# Patient Record
Sex: Male | Born: 1970 | Race: White | Marital: Married | Smoking: Current every day smoker
Health system: Southern US, Community
[De-identification: ages and names within clinical notes are randomized; demographics above are authoritative.]

## PROBLEM LIST (undated history)

## (undated) DIAGNOSIS — E785 Hyperlipidemia, unspecified: Secondary | ICD-10-CM

## (undated) DIAGNOSIS — E039 Hypothyroidism, unspecified: Secondary | ICD-10-CM

---

## 2018-05-03 ENCOUNTER — Emergency Department

## 2018-05-03 ENCOUNTER — Encounter: Payer: Self-pay | Admitting: Emergency Medicine

## 2018-05-03 ENCOUNTER — Other Ambulatory Visit: Payer: Self-pay

## 2018-05-03 ENCOUNTER — Inpatient Hospital Stay
Admission: EM | Admit: 2018-05-03 | Discharge: 2018-05-05 | DRG: 871 | Disposition: A | Discharge status: Home / Self Care | Attending: Internal Medicine | Admitting: Internal Medicine

## 2018-05-03 DIAGNOSIS — Z79899 Other long term (current) drug therapy: Secondary | ICD-10-CM | POA: Diagnosis not present

## 2018-05-03 DIAGNOSIS — J189 Pneumonia, unspecified organism: Secondary | ICD-10-CM | POA: Diagnosis present

## 2018-05-03 DIAGNOSIS — E039 Hypothyroidism, unspecified: Secondary | ICD-10-CM | POA: Diagnosis present

## 2018-05-03 DIAGNOSIS — E785 Hyperlipidemia, unspecified: Secondary | ICD-10-CM | POA: Diagnosis present

## 2018-05-03 DIAGNOSIS — A419 Sepsis, unspecified organism: Secondary | ICD-10-CM | POA: Diagnosis present

## 2018-05-03 DIAGNOSIS — F17210 Nicotine dependence, cigarettes, uncomplicated: Secondary | ICD-10-CM | POA: Diagnosis present

## 2018-05-03 DIAGNOSIS — K920 Hematemesis: Secondary | ICD-10-CM | POA: Diagnosis not present

## 2018-05-03 HISTORY — DX: Hypothyroidism, unspecified: E03.9

## 2018-05-03 HISTORY — DX: Hyperlipidemia, unspecified: E78.5

## 2018-05-03 LAB — CBC WITH DIFFERENTIAL/PLATELET
BASOS ABS: 0 10*3/uL (ref 0–0.1)
Basophils Relative: 0 %
EOS ABS: 0 10*3/uL (ref 0–0.7)
EOS PCT: 0 %
HCT: 39.5 % — ABNORMAL LOW (ref 40.0–52.0)
Hemoglobin: 13.9 g/dL (ref 13.0–18.0)
Lymphocytes Relative: 3 %
Lymphs Abs: 0.6 10*3/uL — ABNORMAL LOW (ref 1.0–3.6)
MCH: 32.3 pg (ref 26.0–34.0)
MCHC: 35.2 g/dL (ref 32.0–36.0)
MCV: 91.8 fL (ref 80.0–100.0)
MONO ABS: 1.3 10*3/uL — AB (ref 0.2–1.0)
Monocytes Relative: 8 %
Neutro Abs: 14.4 10*3/uL — ABNORMAL HIGH (ref 1.4–6.5)
Neutrophils Relative %: 89 %
PLATELETS: 194 10*3/uL (ref 150–440)
RBC: 4.3 MIL/uL — AB (ref 4.40–5.90)
RDW: 12.6 % (ref 11.5–14.5)
WBC: 16.4 10*3/uL — AB (ref 3.8–10.6)

## 2018-05-03 LAB — COMPREHENSIVE METABOLIC PANEL
ALT: 28 U/L (ref 0–44)
AST: 21 U/L (ref 15–41)
Albumin: 3.5 g/dL (ref 3.5–5.0)
Alkaline Phosphatase: 99 U/L (ref 38–126)
Anion gap: 9 (ref 5–15)
BUN: 14 mg/dL (ref 6–20)
CO2: 23 mmol/L (ref 22–32)
Calcium: 9.2 mg/dL (ref 8.9–10.3)
Chloride: 101 mmol/L (ref 98–111)
Creatinine, Ser: 0.83 mg/dL (ref 0.61–1.24)
GFR calc Af Amer: >60 mL/min (ref >60)
GFR calc non Af Amer: >60 mL/min (ref >60)
Glucose, Bld: 150 mg/dL — ABNORMAL HIGH (ref 70–99)
POTASSIUM: 3.3 mmol/L — AB (ref 3.5–5.1)
SODIUM: 133 mmol/L — AB (ref 135–145)
Total Bilirubin: 1.3 mg/dL — ABNORMAL HIGH (ref 0.3–1.2)
Total Protein: 7.5 g/dL (ref 6.5–8.1)

## 2018-05-03 LAB — INFLUENZA PANEL BY PCR (TYPE A & B)
INFLAPCR: NEGATIVE
Influenza B By PCR: NEGATIVE

## 2018-05-03 LAB — URINALYSIS, COMPLETE (UACMP) WITH MICROSCOPIC
Bacteria, UA: NONE SEEN
Glucose, UA: NEGATIVE mg/dL
Ketones, ur: 5 mg/dL — AB
LEUKOCYTES UA: NEGATIVE
Nitrite: NEGATIVE
Protein, ur: >=300 mg/dL — AB
SPECIFIC GRAVITY, URINE: 1.04 — AB (ref 1.005–1.030)
pH: 5 (ref 5.0–8.0)

## 2018-05-03 LAB — LACTIC ACID, PLASMA: Lactic Acid, Venous: 0.8 mmol/L (ref 0.5–1.9)

## 2018-05-03 MED ORDER — KETOROLAC TROMETHAMINE 15 MG/ML IJ SOLN
15.0000 mg | Freq: Once | INTRAMUSCULAR | Status: AC
  Administered 2018-05-03: 15 mg via INTRAVENOUS
  Filled 2018-05-03: qty 1

## 2018-05-03 MED ORDER — ACETAMINOPHEN 650 MG RE SUPP: 650.0000 mg | Freq: Four times a day (QID) | RECTAL | Status: DC | PRN

## 2018-05-03 MED ORDER — SODIUM CHLORIDE 0.9 % IV SOLN
500.0000 mg | Freq: Once | INTRAVENOUS | Status: AC
  Administered 2018-05-03: 500 mg via INTRAVENOUS
  Filled 2018-05-03: qty 500

## 2018-05-03 MED ORDER — LEVOTHYROXINE SODIUM 112 MCG PO TABS
112.0000 ug | ORAL_TABLET | Freq: Every day | ORAL | Status: DC
  Administered 2018-05-04 – 2018-05-05 (×2): 112 ug via ORAL
  Filled 2018-05-03 (×3): qty 1

## 2018-05-03 MED ORDER — ACETAMINOPHEN 325 MG PO TABS
650.0000 mg | ORAL_TABLET | Freq: Four times a day (QID) | ORAL | Status: DC | PRN
  Administered 2018-05-03: 650 mg via ORAL
  Filled 2018-05-03: qty 2

## 2018-05-03 MED ORDER — SODIUM CHLORIDE 0.9 % IV BOLUS
1000.0000 mL | Freq: Once | INTRAVENOUS | Status: AC
  Administered 2018-05-03: 1000 mL via INTRAVENOUS

## 2018-05-03 MED ORDER — IPRATROPIUM-ALBUTEROL 0.5-2.5 (3) MG/3ML IN SOLN
3.0000 mL | RESPIRATORY_TRACT | Status: DC | PRN
  Administered 2018-05-03 – 2018-05-05 (×4): 3 mL via RESPIRATORY_TRACT
  Filled 2018-05-03 (×4): qty 3

## 2018-05-03 MED ORDER — SODIUM CHLORIDE 0.9 % IV SOLN
INTRAVENOUS | Status: DC
  Administered 2018-05-03 – 2018-05-04 (×4): via INTRAVENOUS

## 2018-05-03 MED ORDER — SODIUM CHLORIDE 0.9 % IV SOLN
1.0000 g | INTRAVENOUS | Status: DC
  Administered 2018-05-04: 1 g via INTRAVENOUS
  Filled 2018-05-03: qty 1

## 2018-05-03 MED ORDER — IOHEXOL 350 MG/ML SOLN
75.0000 mL | Freq: Once | INTRAVENOUS | Status: AC | PRN
  Administered 2018-05-03: 75 mL via INTRAVENOUS

## 2018-05-03 MED ORDER — ATORVASTATIN CALCIUM 20 MG PO TABS
40.0000 mg | ORAL_TABLET | Freq: Every day | ORAL | Status: DC
  Filled 2018-05-03: qty 2

## 2018-05-03 MED ORDER — ENOXAPARIN SODIUM 40 MG/0.4ML ~~LOC~~ SOLN
40.0000 mg | SUBCUTANEOUS | Status: DC
  Administered 2018-05-04: 40 mg via SUBCUTANEOUS
  Filled 2018-05-03: qty 0.4

## 2018-05-03 MED ORDER — SODIUM CHLORIDE 0.9 % IV SOLN
1.0000 g | Freq: Once | INTRAVENOUS | Status: AC
  Administered 2018-05-03: 1 g via INTRAVENOUS
  Filled 2018-05-03: qty 10

## 2018-05-03 MED ORDER — AZITHROMYCIN 250 MG PO TABS
250.0000 mg | ORAL_TABLET | Freq: Every day | ORAL | Status: DC
  Administered 2018-05-04 – 2018-05-05 (×2): 250 mg via ORAL
  Filled 2018-05-03 (×2): qty 1

## 2018-05-03 MED ORDER — ONDANSETRON HCL 4 MG PO TABS: 4.0000 mg | ORAL_TABLET | Freq: Four times a day (QID) | ORAL | Status: DC | PRN

## 2018-05-03 MED ORDER — ONDANSETRON HCL 4 MG/2ML IJ SOLN: 4.0000 mg | Freq: Four times a day (QID) | INTRAMUSCULAR | Status: DC | PRN

## 2018-05-03 MED ORDER — IBUPROFEN 600 MG PO TABS
600.0000 mg | ORAL_TABLET | Freq: Once | ORAL | Status: AC
  Administered 2018-05-03: 600 mg via ORAL
  Filled 2018-05-03: qty 1

## 2018-05-03 NOTE — ED Notes (Signed)
Report to JD, RN

## 2018-05-03 NOTE — ED Provider Notes (Signed)
[facility] Emergency Department Provider Note  ____________________________________________   I have reviewed the triage vital signs and the nursing notes. Where available I have reviewed prior notes and, if possible and indicated, outside hospital notes.    HISTORY  Chief Complaint Generalized Body Aches    HPI Eugene Barnes is a 47 y.o. male  Who is healthy, states over the last week he had cough, headache, body aches, chills, and fever, he went to a urgent care and was told that he had a viral syndrome, they did however give him a prescription for azithromycin but he did not fill it because he was told it was a viral syndrome he states.  He states he has been feeling generally somewhat weak and debilitated and he persistent feeling bad.  He has had a cough.  When he coughs it makes his head hurt.  Is not the worst headache of life, the biggest problem is a cough and shortness of breath which is starting to progress.  He does work as a Camera operator, no leg swelling no history of PE or DVT. No leg swelling, no family history of PE or DVT.   Past Medical History:  Diagnosis Date  . Hyperlipidemia   . Hypothyroidism     Patient Active Problem List   Diagnosis Date Noted  . Sepsis (HCC) 05/03/2018    History reviewed. No pertinent surgical history.  Prior to Admission medications   Medication Sig Start Date End Date Taking? Authorizing Provider  acetaminophen (TYLENOL) 500 MG tablet Take 500-1,000 mg by mouth every 6 (six) hours as needed for moderate pain or fever.   Yes [provider]  atorvastatin (LIPITOR) 40 MG tablet Take 40 mg by mouth daily.   Yes [provider]  ibuprofen (ADVIL,MOTRIN) 200 MG tablet Take 400-600 mg by mouth every 6 (six) hours as needed for fever or moderate pain.   Yes [provider]  levothyroxine (SYNTHROID, LEVOTHROID) 112 MCG tablet Take 112 mcg by mouth daily before breakfast.   Yes  [provider]    Allergies Patient has no known allergies.  Family History  Problem Relation Age of Onset  . Epilepsy Mother   . Diabetes Father   . Hypertension Father     Social History Social History   Tobacco Use  . Smoking status: Current Every Day Smoker    Packs/day: 1.00    Years: 27.00    Pack years: 27.00    Types: Cigarettes  . Smokeless tobacco: Never Used  Substance Use Topics  . Alcohol use: Yes    Frequency: Never    Comment: socially  . Drug use: Never    Review of Systems Constitutional: + fever/chills Eyes: No visual changes. ENT: No sore throat. No stiff neck no neck pain Cardiovascular: Denies chest pain. Respiratory: Positive cough, mild shortness of breath. Gastrointestinal:   no vomiting.  No diarrhea.  No constipation. Genitourinary: Negative for dysuria. Musculoskeletal: Negative lower extremity swelling Skin: Negative for rash. Neurological: Negative for severe headaches, focal weakness or numbness.   ____________________________________________   PHYSICAL EXAM:  VITAL SIGNS: ED Triage Vitals [05/03/18 1022]  Enc Vitals Group     BP 138/80     Pulse Rate (!) 109     Resp 20     Temp 99.1 F (37.3 C)     Temp Source Oral     SpO2 95 %     Weight 181 lb (82.1 kg)     Height  5\' 7"  (1.702 m)     Head Circumference      Peak Flow      Pain Score 6     Pain Loc      Pain Edu?      Excl. in GC?     Constitutional: Alert and oriented.  She does not look as if he feels well but he does not look toxic Eyes: Conjunctivae are normal Head: Atraumatic HEENT: No congestion/rhinnorhea. Mucous membranes are moist.  Oropharynx non-erythematous Neck:   Nontender with no meningismus, no masses, no stridor Cardiovascular: Normal rate, regular rhythm. Grossly normal heart sounds.  Good peripheral circulation. Respiratory: Normal respiratory effort.  Diffuse rhonchi appreciated multiple fields no wheeze Abdominal: Soft and  nontender. No distention. No guarding no rebound Back:  There is no focal tenderness or step off.  there is no midline tenderness there are no lesions noted. there is no CVA tenderness Musculoskeletal: No lower extremity tenderness, no upper extremity tenderness. No joint effusions, no DVT signs strong distal pulses no edema Neurologic:  Normal speech and language. No gross focal neurologic deficits are appreciated.  Skin:  Skin is warm, dry and intact. No rash noted. Psychiatric: Mood and affect are normal. Speech and behavior are normal.  ____________________________________________   LABS (all labs ordered are listed, but only abnormal results are displayed)  Labs Reviewed  CBC WITH DIFFERENTIAL/PLATELET - Abnormal; Notable for the following components:      Result Value   WBC 16.4 (*)    RBC 4.30 (*)    HCT 39.5 (*)    Neutro Abs 14.4 (*)    Lymphs Abs 0.6 (*)    Monocytes Absolute 1.3 (*)    All other components within normal limits  COMPREHENSIVE METABOLIC PANEL - Abnormal; Notable for the following components:   Sodium 133 (*)    Potassium 3.3 (*)    Glucose, Bld 150 (*)    Total Bilirubin 1.3 (*)    All other components within normal limits  URINALYSIS, COMPLETE (UACMP) WITH MICROSCOPIC - Abnormal; Notable for the following components:   Color, Urine AMBER (*)    APPearance HAZY (*)    Specific Gravity, Urine 1.040 (*)    Hgb urine dipstick MODERATE (*)    Bilirubin Urine SMALL (*)    Ketones, ur 5 (*)    Protein, ur >=300 (*)    All other components within normal limits  CULTURE, BLOOD (ROUTINE X 2)  CULTURE, BLOOD (ROUTINE X 2)  LACTIC ACID, PLASMA  INFLUENZA PANEL BY PCR (TYPE A & B)  HIV ANTIBODY (ROUTINE TESTING)  BASIC METABOLIC PANEL  CBC    Pertinent labs  results that were available during my care of the patient were reviewed by me and considered in my medical decision making (see chart for  details). ____________________________________________  EKG  I personally interpreted any EKGs ordered by me or triage  ____________________________________________  RADIOLOGY  Pertinent labs & imaging results that were available during my care of the patient were reviewed by me and considered in my medical decision making (see chart for details). If possible, patient and/or family made aware of any abnormal findings.  Dg Chest 2 View  Result Date: 05/03/2018 CLINICAL DATA:  Smoker with viral symptoms. EXAM: CHEST - 2 VIEW COMPARISON:  None. FINDINGS: Rounded opacity in the lingula. Opacity in the medial right upper lobe with volume loss. The right paratracheal stripe is not well assessed due to the adjacent medial right upper lobe opacity. No  hilar abnormalities. No mediastinal abnormalities elsewhere. No pneumothorax. No other acute findings. IMPRESSION: 1. Rounded opacity/infiltrate in the lingula. Somewhat rounded opacity with associated volume loss in the medial right upper lobe. These findings could represent multifocal pneumonia. Underlying neoplasm is not excluded on this single study. If the patient has symptoms of pneumonia, treatment with a short-term follow-up x-ray could be performed. If there are not clear signs of infection, recommend CT imaging. Electronically Signed   By: Gerome Sam III M.D   On: 05/03/2018 14:22   Ct Angio Chest Pe W And/or Wo Contrast  Result Date: 05/03/2018 CLINICAL DATA:  47 year old male with acute cough, chest pain and fever for 6 days. EXAM: CT ANGIOGRAPHY CHEST WITH CONTRAST TECHNIQUE: Multidetector CT imaging of the chest was performed using the standard protocol during bolus administration of intravenous contrast. Multiplanar CT image reconstructions and MIPs were obtained to evaluate the vascular anatomy. CONTRAST:  37mL OMNIPAQUE IOHEXOL 350 MG/ML SOLN COMPARISON:  05/03/2018 chest radiograph FINDINGS: Cardiovascular: This is a technically  borderline study. No pulmonary emboli are identified. There is no evidence of thoracic aortic aneurysm. Normal heart size without pericardial effusion. Mediastinum/Nodes: Mildly enlarged RIGHT hilar lymph nodes are likely reactive. No mediastinal mass identified. Visualized thyroid gland and esophagus are unremarkable. Lungs/Pleura: Moderate to large area of consolidation within the RIGHT UPPER lobe, consolidation/airspace disease within the LEFT UPPER lobe/lingula and mild airspace opacity within the SUPERIOR segment of the RIGHT LOWER lobe identified. These pulmonary opacities likely represent pneumonia. No pleural effusion or pneumothorax. No endobronchial or endotracheal lesion identified. Upper Abdomen: No acute abnormality. Musculoskeletal: No acute or suspicious bony abnormalities. Review of the MIP images confirms the above findings. IMPRESSION: 1. Consolidation/airspace disease within bilateral UPPER lobes/lingula and SUPERIOR segment RIGHT LOWER lobe compatible with multifocal pneumonia. Radiographic follow-up to resolution is recommended. 2. No evidence of pulmonary emboli or thoracic aortic aneurysm. Electronically Signed   By: Harmon Pier M.D.   On: 05/03/2018 15:37   ____________________________________________    PROCEDURES  Procedure(s) performed: None  Procedures  Critical Care performed: None  ____________________________________________   INITIAL IMPRESSION / ASSESSMENT AND PLAN / ED COURSE  Pertinent labs & imaging results that were available during my care of the patient were reviewed by me and considered in my medical decision making (see chart for details).  She with multilobar pneumonia, he has been sick for some time, I did a CT scan to rule out oncologic or pulmonary embolism.  This is negative, however given extensive pneumonia, sats in the low 90s, and apparently rapidly progressive nature of this illness we will start him on empiric community acquired pneumonia  antibiotics and admit.    ____________________________________________   FINAL CLINICAL IMPRESSION(S) / ED DIAGNOSES  Final diagnoses:  Community acquired pneumonia, unspecified laterality      This chart was dictated using voice recognition software.  Despite best efforts to proofread,  errors can occur which can change meaning.      Jeanmarie Plant, MD 05/03/18 2122

## 2018-05-03 NOTE — ED Notes (Signed)
First Nurse Note: Patient states he has generalized body aches and fever.  He states this is his third ER visit in 6 days, seen previously in [place].  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  States he was told "it's a virus".  Alert and oriented, NAD.

## 2018-05-03 NOTE — ED Triage Notes (Signed)
Pt has not been feeling well all week. Seen in ED X 2. Started with back pain and difficulty squeezing hands.  Having generalized body pains.  Also c/o significant headache, he describes as stabbing pains.  Nausea but no vomiting. Blurry vision at times with headache.  Fever 101 other day; +chills. No known tic bites. Has been taking motrin and tylenol around the clock.

## 2018-05-03 NOTE — H&P (Signed)
[facility] NAME: Eugene Barnes    MR#:  [id number]  DATE OF BIRTH:  10-30-1970  DATE OF ADMISSION:  05/03/2018  PRIMARY CARE PHYSICIAN: System, Pcp Not In   REQUESTING/REFERRING PHYSICIAN: Dr. Charlotte Crumb  CHIEF COMPLAINT:   Chief Complaint  Patient presents with  . Generalized Body Aches    HISTORY OF PRESENT ILLNESS:  Eugene Barnes  is a 47 y.o. male with a known history of hyperlipidemia, hypothyroidism who presents to the hospital due to her shortness of breath, generalized aches.  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  He complains of generalized body aches, chills, shortness of breath and cough with productive sputum which has not been improving and therefore presents to the ER.  Patient met sepsis criteria given his fever, tachycardia, leukocytosis and CT chest findings suggestive of multifocal pneumonia.  Hospitalist services were contacted for admission.  Patient presently denies any chest pains admits to some intermittent nausea with some vomiting no abdominal pain no dysuria no hematuria no other associated symptoms.  PAST MEDICAL HISTORY:   Past Medical History:  Diagnosis Date  . Hyperlipidemia   . Hypothyroidism     PAST SURGICAL HISTORY:  History reviewed. No pertinent surgical history.  SOCIAL HISTORY:   Social History   Tobacco Use  . Smoking status: Current Every Day Smoker    Packs/day: 1.00    Years: 27.00    Pack years: 27.00    Types: Cigarettes  . Smokeless tobacco: Never Used  Substance Use Topics  . Alcohol use: Yes    Frequency: Never    Comment: socially    FAMILY HISTORY:   Family History  Problem Relation Age of Onset  . Epilepsy Mother   . Diabetes Father   . Hypertension Father     DRUG ALLERGIES:  No Known Allergies  REVIEW OF SYSTEMS:   Review of  Systems  Constitutional: Positive for chills and fever. Negative for weight loss.  HENT: Negative for congestion, nosebleeds and tinnitus.   Eyes: Negative for blurred vision, double vision and redness.  Respiratory: Positive for cough, sputum production and shortness of breath. Negative for hemoptysis.   Cardiovascular: Negative for chest pain, orthopnea, leg swelling and PND.  Gastrointestinal: Negative for abdominal pain, diarrhea, melena, nausea and vomiting.  Genitourinary: Negative for dysuria, hematuria and urgency.  Musculoskeletal: Negative for falls and joint pain.  Neurological: Negative for dizziness, tingling, sensory change, focal weakness, seizures, weakness and headaches.  Endo/Heme/Allergies: Negative for polydipsia. Does not bruise/bleed easily.  Psychiatric/Behavioral: Negative for depression and memory loss. The patient is not nervous/anxious.     MEDICATIONS AT HOME:   Prior to Admission medications   Medication Sig Start Date End Date Taking? Authorizing Provider  acetaminophen (TYLENOL) 500 MG tablet Take 500-1,000 mg by mouth every 6 (six) hours as needed for moderate pain or fever.   Yes [provider]  atorvastatin (LIPITOR) 40 MG tablet Take 40 mg by mouth daily.   Yes [provider]  ibuprofen (ADVIL,MOTRIN) 200 MG tablet Take 400-600 mg by mouth every 6 (six) hours as needed for fever or moderate pain.   Yes [provider]  levothyroxine (SYNTHROID, LEVOTHROID) 112 MCG tablet Take 112 mcg by mouth daily before breakfast.   Yes [provider]  VITAL SIGNS:  Blood pressure 133/81, pulse (!) 107, temperature 99.8 F (37.7 C), temperature source Oral, resp. rate 18, height _0  (1.702 m), weight 82.1 kg, SpO2 94 %.  PHYSICAL EXAMINATION:  Physical Exam  GENERAL:  47 y.o.-year-old patient lying in the bed in no acute distress.  EYES: Pupils equal, round, reactive to light and accommodation. No scleral icterus.  Extraocular muscles intact.  HEENT: Head atraumatic, normocephalic. Oropharynx and nasopharynx clear. No oropharyngeal erythema, moist oral mucosa  NECK:  Supple, no jugular venous distention. No thyroid enlargement, no tenderness.  LUNGS: Normal breath sounds bilaterally, no wheezing, rales, rhonchi. No use of accessory muscles of respiration.  CARDIOVASCULAR: S1, S2 RRR. No murmurs, rubs, gallops, clicks.  ABDOMEN: Soft, nontender, nondistended. Bowel sounds present. No organomegaly or mass.  EXTREMITIES: No pedal edema, cyanosis, or clubbing. + 2 pedal & radial pulses b/l.   NEUROLOGIC: Cranial nerves II through XII are intact. No focal Motor or sensory deficits appreciated b/l PSYCHIATRIC: The patient is alert and oriented x 3.  SKIN: No obvious rash, lesion, or ulcer.   LABORATORY PANEL:   CBC Recent Labs  Lab 05/03/18 1034  WBC 16.4*  HGB 13.9  HCT 39.5*  PLT 194   ------------------------------------------------------------------------------------------------------------------  Chemistries  Recent Labs  Lab 05/03/18 1034  NA 133*  K 3.3*  CL 101  CO2 23  GLUCOSE 150*  BUN 14  CREATININE 0.83  CALCIUM 9.2  AST 21  ALT 28  ALKPHOS 99  BILITOT 1.3*   ------------------------------------------------------------------------------------------------------------------  Cardiac Enzymes No results for input(s): TROPONINI in the last 168 hours. ------------------------------------------------------------------------------------------------------------------  RADIOLOGY:  Dg Chest 2 View  Result Date: 05/03/2018 CLINICAL DATA:  Smoker with viral symptoms. EXAM: CHEST - 2 VIEW COMPARISON:  None. FINDINGS: Rounded opacity in the lingula. Opacity in the medial right upper lobe with volume loss. The right paratracheal stripe is not well assessed due to the adjacent medial right upper lobe opacity. No hilar abnormalities. No mediastinal abnormalities elsewhere. No pneumothorax.  No other acute findings. IMPRESSION: 1. Rounded opacity/infiltrate in the lingula. Somewhat rounded opacity with associated volume loss in the medial right upper lobe. These findings could represent multifocal pneumonia. Underlying neoplasm is not excluded on this single study. If the patient has symptoms of pneumonia, treatment with a short-term follow-up x-ray could be performed. If there are not clear signs of infection, recommend CT imaging. Electronically Signed   By: Dorise Bullion III M.D   On: 05/03/2018 14:22   Ct Angio Chest Pe W And/or Wo Contrast  Result Date: 05/03/2018 CLINICAL DATA:  47 year old male with acute cough, chest pain and fever for 6 days. EXAM: CT ANGIOGRAPHY CHEST WITH CONTRAST TECHNIQUE: Multidetector CT imaging of the chest was performed using the standard protocol during bolus administration of intravenous contrast. Multiplanar CT image reconstructions and MIPs were obtained to evaluate the vascular anatomy. CONTRAST:  61m OMNIPAQUE IOHEXOL 350 MG/ML SOLN COMPARISON:  05/03/2018 chest radiograph FINDINGS: Cardiovascular: This is a technically borderline study. No pulmonary emboli are identified. There is no evidence of thoracic aortic aneurysm. Normal heart size without pericardial effusion. Mediastinum/Nodes: Mildly enlarged RIGHT hilar lymph nodes are likely reactive. No mediastinal mass identified. Visualized thyroid gland and esophagus are unremarkable. Lungs/Pleura: Moderate to large area of consolidation within the RIGHT UPPER lobe, consolidation/airspace disease within the LEFT UPPER lobe/lingula and mild airspace opacity within the SUPERIOR segment of the RIGHT LOWER lobe identified. These pulmonary opacities likely represent pneumonia. No pleural effusion or pneumothorax. No endobronchial or  endotracheal lesion identified. Upper Abdomen: No acute abnormality. Musculoskeletal: No acute or suspicious bony abnormalities. Review of the MIP images confirms the above findings.  IMPRESSION: 1. Consolidation/airspace disease within bilateral UPPER lobes/lingula and SUPERIOR segment RIGHT LOWER lobe compatible with multifocal pneumonia. Radiographic follow-up to resolution is recommended. 2. No evidence of pulmonary emboli or thoracic aortic aneurysm. Electronically Signed   By: Margarette Canada M.D.   On: 05/03/2018 15:37     IMPRESSION AND PLAN:   47 year old male with past medical history of hyperlipidemia, hypothyroidism who presents to the hospital due to shortness of breath, generalized body aches, chills and noted to have sepsis secondary to multifocal pneumonia.  1.  Sepsis-patient meets criteria given his fever, tachycardia, leukocytosis and CT chest findings suggestive of multifocal pneumonia. -We will treat patient with IV antibiotics with ceftriaxone, Zithromax for pneumonia.  Follow cultures, hemodynamics.  2.  Pneumonia-source of patient's sepsis. - Treat the patient with IV ceftriaxone, Zithromax.  Follow cultures.  3.  Leukocytosis-secondary to the sepsis/pneumonia.  Follow with IV and back therapy.  4.  Hypothyroidism-continue Synthroid.  5.  Hyperlipidemia- continue atorvastatin.    All the records are reviewed and case discussed with ED provider. Management plans discussed with the patient, family and they are in agreement.  CODE STATUS: Full code  TOTAL TIME TAKING CARE OF THIS PATIENT: 40 minutes.    Henreitta Leber M.D on 05/03/2018 at 4:51 PM  Between 7am to 6pm - Pager - [phone]  After 6pm go to [email or web address] - password [email or web address] [facility]  Office  [phone]  CC: Primary care physician; System, Pcp Not In

## 2018-05-04 LAB — CBC
HCT: 34.6 % — ABNORMAL LOW (ref 40.0–52.0)
HEMOGLOBIN: 12.2 g/dL — AB (ref 13.0–18.0)
MCH: 32.5 pg (ref 26.0–34.0)
MCHC: 35.3 g/dL (ref 32.0–36.0)
MCV: 92.1 fL (ref 80.0–100.0)
Platelets: 201 10*3/uL (ref 150–440)
RBC: 3.76 MIL/uL — AB (ref 4.40–5.90)
RDW: 12.9 % (ref 11.5–14.5)
WBC: 13 10*3/uL — ABNORMAL HIGH (ref 3.8–10.6)

## 2018-05-04 LAB — BASIC METABOLIC PANEL
Anion gap: 8 (ref 5–15)
BUN: 15 mg/dL (ref 6–20)
CALCIUM: 8.5 mg/dL — AB (ref 8.9–10.3)
CO2: 24 mmol/L (ref 22–32)
CREATININE: 0.82 mg/dL (ref 0.61–1.24)
Chloride: 103 mmol/L (ref 98–111)
GFR calc Af Amer: >60 mL/min (ref >60)
GFR calc non Af Amer: >60 mL/min (ref >60)
Glucose, Bld: 107 mg/dL — ABNORMAL HIGH (ref 70–99)
Potassium: 3.5 mmol/L (ref 3.5–5.1)
Sodium: 135 mmol/L (ref 135–145)

## 2018-05-04 MED ORDER — BISACODYL 5 MG PO TBEC: 5.0000 mg | DELAYED_RELEASE_TABLET | Freq: Every day | ORAL | Status: DC | PRN

## 2018-05-04 MED ORDER — KETOROLAC TROMETHAMINE 15 MG/ML IJ SOLN
15.0000 mg | Freq: Four times a day (QID) | INTRAMUSCULAR | Status: DC | PRN
  Administered 2018-05-04 – 2018-05-05 (×3): 15 mg via INTRAVENOUS
  Filled 2018-05-04 (×3): qty 1

## 2018-05-04 MED ORDER — IBUPROFEN 400 MG PO TABS: 400.0000 mg | ORAL_TABLET | Freq: Four times a day (QID) | ORAL | Status: DC | PRN

## 2018-05-04 MED ORDER — GUAIFENESIN-DM 100-10 MG/5ML PO SYRP
5.0000 mL | ORAL_SOLUTION | ORAL | Status: DC | PRN
  Administered 2018-05-04 (×2): 5 mL via ORAL
  Filled 2018-05-04 (×2): qty 5

## 2018-05-04 MED ORDER — KETOROLAC TROMETHAMINE 15 MG/ML IJ SOLN
15.0000 mg | Freq: Once | INTRAMUSCULAR | Status: AC
  Administered 2018-05-04: 15 mg via INTRAVENOUS
  Filled 2018-05-04: qty 1

## 2018-05-04 MED ORDER — SENNA 8.6 MG PO TABS: 1.0000 | ORAL_TABLET | Freq: Every day | ORAL | Status: DC | PRN

## 2018-05-04 NOTE — Progress Notes (Addendum)
[facility] [place] at [facility]   PATIENT NAME: Eugene Barnes    MR#:  [id number]  DATE OF BIRTH:  March 15, 1971  SUBJECTIVE:  CHIEF COMPLAINT:   Chief Complaint  Patient presents with  . Generalized Body Aches   Still cough and shortness of breath.  Hematemesis this morning. REVIEW OF SYSTEMS:  Review of Systems  Constitutional: Positive for chills, fever and malaise/fatigue.  HENT: Negative for sore throat.   Eyes: Negative for blurred vision and double vision.  Respiratory: Positive for cough, hemoptysis, sputum production and shortness of breath. Negative for wheezing and stridor.   Cardiovascular: Negative for chest pain, palpitations, orthopnea and leg swelling.  Gastrointestinal: Negative for abdominal pain, blood in stool, diarrhea, melena, nausea and vomiting.  Genitourinary: Negative for dysuria, flank pain and hematuria.  Musculoskeletal: Negative for back pain and joint pain.  Skin: Negative for rash.  Neurological: Positive for headaches. Negative for dizziness, sensory change, focal weakness, seizures, loss of consciousness and weakness.  Endo/Heme/Allergies: Negative for polydipsia.  Psychiatric/Behavioral: Negative for depression. The patient is not nervous/anxious.     DRUG ALLERGIES:  No Known Allergies VITALS:  Blood pressure 104/67, pulse 91, temperature 99.3 F (37.4 C), temperature source Oral, resp. rate 20, height 5\' 7"  (1.702 m), weight 82.1 kg, SpO2 93 %. PHYSICAL EXAMINATION:  Physical Exam  Constitutional: He is oriented to person, place, and time. He appears well-developed.  HENT:  Head: Normocephalic.  Mouth/Throat: Oropharynx is clear and moist.  Eyes: Pupils are equal, round, and reactive to light. Conjunctivae and EOM are normal. No scleral icterus.  Neck: Normal range of motion. Neck supple. No JVD present. No tracheal deviation present.  Cardiovascular: Normal rate, regular rhythm and normal heart sounds. Exam  reveals no gallop.  No murmur heard. Pulmonary/Chest: Effort normal and breath sounds normal. No respiratory distress. He has no wheezes. He has no rales.  Crackles  Abdominal: Soft. Bowel sounds are normal. He exhibits no distension. There is no tenderness. There is no rebound.  Musculoskeletal: Normal range of motion. He exhibits no edema or tenderness.  Neurological: He is alert and oriented to person, place, and time. No cranial nerve deficit.  Skin: No rash noted. No erythema.  Psychiatric: He has a normal mood and affect.   LABORATORY PANEL:  Male CBC Recent Labs  Lab 05/04/18 0339  WBC 13.0*  HGB 12.2*  HCT 34.6*  PLT 201   ------------------------------------------------------------------------------------------------------------------ Chemistries  Recent Labs  Lab 05/03/18 1034 05/04/18 0339  NA 133* 135  K 3.3* 3.5  CL 101 103  CO2 23 24  GLUCOSE 150* 107*  BUN 14 15  CREATININE 0.83 0.82  CALCIUM 9.2 8.5*  AST 21  --   ALT 28  --   ALKPHOS 99  --   BILITOT 1.3*  --    RADIOLOGY:  Dg Chest 2 View  Result Date: 05/03/2018 CLINICAL DATA:  Smoker with viral symptoms. EXAM: CHEST - 2 VIEW COMPARISON:  None. FINDINGS: Rounded opacity in the lingula. Opacity in the medial right upper lobe with volume loss. The right paratracheal stripe is not well assessed due to the adjacent medial right upper lobe opacity. No hilar abnormalities. No mediastinal abnormalities elsewhere. No pneumothorax. No other acute findings. IMPRESSION: 1. Rounded opacity/infiltrate in the lingula. Somewhat rounded opacity with associated volume loss in the medial right upper lobe. These findings could represent multifocal pneumonia. Underlying neoplasm is not excluded on this single study. If the patient has symptoms of pneumonia,  treatment with a short-term follow-up x-ray could be performed. If there are not clear signs of infection, recommend CT imaging. Electronically Signed   By: Gerome Sam III M.D   On: 05/03/2018 14:22   Ct Angio Chest Pe W And/or Wo Contrast  Result Date: 05/03/2018 CLINICAL DATA:  47 year old male with acute cough, chest pain and fever for 6 days. EXAM: CT ANGIOGRAPHY CHEST WITH CONTRAST TECHNIQUE: Multidetector CT imaging of the chest was performed using the standard protocol during bolus administration of intravenous contrast. Multiplanar CT image reconstructions and MIPs were obtained to evaluate the vascular anatomy. CONTRAST:  75mL OMNIPAQUE IOHEXOL 350 MG/ML SOLN COMPARISON:  05/03/2018 chest radiograph FINDINGS: Cardiovascular: This is a technically borderline study. No pulmonary emboli are identified. There is no evidence of thoracic aortic aneurysm. Normal heart size without pericardial effusion. Mediastinum/Nodes: Mildly enlarged RIGHT hilar lymph nodes are likely reactive. No mediastinal mass identified. Visualized thyroid gland and esophagus are unremarkable. Lungs/Pleura: Moderate to large area of consolidation within the RIGHT UPPER lobe, consolidation/airspace disease within the LEFT UPPER lobe/lingula and mild airspace opacity within the SUPERIOR segment of the RIGHT LOWER lobe identified. These pulmonary opacities likely represent pneumonia. No pleural effusion or pneumothorax. No endobronchial or endotracheal lesion identified. Upper Abdomen: No acute abnormality. Musculoskeletal: No acute or suspicious bony abnormalities. Review of the MIP images confirms the above findings. IMPRESSION: 1. Consolidation/airspace disease within bilateral UPPER lobes/lingula and SUPERIOR segment RIGHT LOWER lobe compatible with multifocal pneumonia. Radiographic follow-up to resolution is recommended. 2. No evidence of pulmonary emboli or thoracic aortic aneurysm. Electronically Signed   By: Harmon Pier M.D.   On: 05/03/2018 15:37   ASSESSMENT AND PLAN:   47 year old male with past medical history of hyperlipidemia, hypothyroidism who presents to the hospital due to  shortness of breath, generalized body aches, chills and noted to have sepsis secondary to multifocal pneumonia.  1.  Sepsis-patient meets criteria given his fever, tachycardia, leukocytosis and CT chest findings suggestive of multifocal pneumonia. Continue ceftriaxone, Zithromax for pneumonia.  Follow cultures.  2.  Pneumonia-source of patient's sepsis. Continue ceftriaxone, Zithromax.  Follow cultures.  3.  Leukocytosis-secondary to the sepsis/pneumonia.    Improving.  4.  Hypothyroidism-continue Synthroid.  5.  Hyperlipidemia- continue atorvastatin.  Tobacco abuse.  Smoking cessation was counseled for 3 to 4 minutes. All the records are reviewed and case discussed with Care Management/Social Worker. Management plans discussed with the patient, family and they are in agreement.  CODE STATUS: Full Code  TOTAL TIME TAKING CARE OF THIS PATIENT: 37 minutes.   More than 50% of the time was spent in counseling/coordination of care: YES  POSSIBLE D/C IN 2 DAYS, DEPENDING ON CLINICAL CONDITION.   [name] M.D on 05/04/2018 at 1:46 PM  Between 7am to 6pm - Pager - [phone]  After 6pm go to [email or web address] - Therapist, nutritional Hospitalists

## 2018-05-04 NOTE — Progress Notes (Signed)
Pt with headach this morning wanting to know if he can have another dose of Toradol this morning. Spoke with DR. Diamond, may order 15mg  Toradol iv one time.

## 2018-05-05 LAB — CBC
HCT: 34.7 % — ABNORMAL LOW (ref 40.0–52.0)
Hemoglobin: 12.1 g/dL — ABNORMAL LOW (ref 13.0–18.0)
MCH: 32.5 pg (ref 26.0–34.0)
MCHC: 34.8 g/dL (ref 32.0–36.0)
MCV: 93.3 fL (ref 80.0–100.0)
PLATELETS: 215 10*3/uL (ref 150–440)
RBC: 3.72 MIL/uL — AB (ref 4.40–5.90)
RDW: 13.1 % (ref 11.5–14.5)
WBC: 10.6 10*3/uL (ref 3.8–10.6)

## 2018-05-05 MED ORDER — AZITHROMYCIN 500 MG PO TABS: 500.0000 mg | ORAL_TABLET | Freq: Every day | ORAL | 0 refills | Status: AC

## 2018-05-05 MED ORDER — LEVOFLOXACIN 500 MG PO TABS: 750.0000 mg | ORAL_TABLET | Freq: Every day | ORAL | Status: DC

## 2018-05-05 MED ORDER — CEFDINIR 300 MG PO CAPS: 300.0000 mg | ORAL_CAPSULE | Freq: Two times a day (BID) | ORAL | 0 refills | Status: AC

## 2018-05-05 MED ORDER — CEFDINIR 300 MG PO CAPS: 300.0000 mg | ORAL_CAPSULE | Freq: Two times a day (BID) | ORAL | Status: DC

## 2018-05-05 MED ORDER — SODIUM CHLORIDE 0.9 % IV SOLN
1.0000 g | INTRAVENOUS | Status: DC
  Administered 2018-05-05: 1 g via INTRAVENOUS
  Filled 2018-05-05: qty 1

## 2018-05-05 MED ORDER — GUAIFENESIN-DM 100-10 MG/5ML PO SYRP: 5.0000 mL | ORAL_SOLUTION | ORAL | 0 refills | Status: AC | PRN

## 2018-05-05 MED ORDER — ALBUTEROL SULFATE HFA 108 (90 BASE) MCG/ACT IN AERS: 2.0000 | INHALATION_SPRAY | Freq: Four times a day (QID) | RESPIRATORY_TRACT | 1 refills | Status: AC | PRN

## 2018-05-05 MED ORDER — LEVOFLOXACIN 750 MG PO TABS: 750.0000 mg | ORAL_TABLET | Freq: Every day | ORAL | Status: DC

## 2018-05-05 MED ORDER — IPRATROPIUM-ALBUTEROL 0.5-2.5 (3) MG/3ML IN SOLN: 3.0000 mL | Freq: Three times a day (TID) | RESPIRATORY_TRACT | Status: DC

## 2018-05-05 NOTE — Progress Notes (Signed)
Pt ready for discharge home today per MD. Patient assessment unchanged from this morning. Reviewed discharge instructions and prescriptions with pt and his wife; all questions answered and pt verbalized understanding. PIV removed, VSS. Pt assisted to car via volunteer. Wife will be driving pt back home to [place].  Eugene Barnes]

## 2018-05-05 NOTE — Discharge Summary (Signed)
[facility] [place] at [facility]   PATIENT NAME: Eugene Barnes    MR#:  [id number]  DATE OF BIRTH:  Feb 01, 1971  DATE OF ADMISSION:  05/03/2018   ADMITTING PHYSICIAN: Houston Siren, MD  DATE OF DISCHARGE: 05/05/2018 11:00 AM  PRIMARY CARE PHYSICIAN: System, Pcp Not In   ADMISSION DIAGNOSIS:  Community acquired pneumonia, unspecified laterality [J18.9] DISCHARGE DIAGNOSIS:  Active Problems:   Sepsis (HCC)  SECONDARY DIAGNOSIS:   Past Medical History:  Diagnosis Date  . Hyperlipidemia   . Hypothyroidism    HOSPITAL COURSE:  47 year old male with past medical history of hyperlipidemia, hypothyroidism who presents to the hospital due to shortness of breath, generalized body aches, chills and noted to have sepsis secondary to multifocal pneumonia.  1. Sepsis-patient meets criteria given his fever, tachycardia, leukocytosis and CT chest findings suggestive of multifocal pneumonia. He has been treated with ceftriaxone, Zithromax for pneumonia. Negative blood cultures so far.  Changed to p.o. Zithromax and cefdinir for 5 more days.  Robitussin as needed.  Albuterol as needed.  2. Pneumonia-source of patient's sepsis. As above.  3. Leukocytosis-secondary to the sepsis/pneumonia.   Improved.  4. Hypothyroidism-continue Synthroid.  5. Hyperlipidemia- continue atorvastatin.  Tobacco abuse.  Smoking cessation was counseled for 3 to 4 minutes.  DISCHARGE CONDITIONS:  Stable, discharged to home today. CONSULTS OBTAINED:   DRUG ALLERGIES:  No Known Allergies DISCHARGE MEDICATIONS:   Allergies as of 05/05/2018   No Known Allergies     Medication List    TAKE these medications   acetaminophen 500 MG tablet Commonly known as:  TYLENOL Take 500-1,000 mg by mouth every 6 (six) hours as needed for moderate pain or fever.   albuterol 108 (90 Base) MCG/ACT inhaler Commonly known as:  PROVENTIL HFA;VENTOLIN HFA Inhale 2 puffs into the lungs  every 6 (six) hours as needed for wheezing or shortness of breath.   atorvastatin 40 MG tablet Commonly known as:  LIPITOR Take 40 mg by mouth daily.   azithromycin 500 MG tablet Commonly known as:  ZITHROMAX Take 1 tablet (500 mg total) by mouth daily.   cefdinir 300 MG capsule Commonly known as:  OMNICEF Take 1 capsule (300 mg total) by mouth every 12 (twelve) hours. Start taking on:  05/06/2018   guaiFENesin-dextromethorphan 100-10 MG/5ML syrup Commonly known as:  ROBITUSSIN DM Take 5 mLs by mouth every 4 (four) hours as needed for cough (chest congestion).   ibuprofen 200 MG tablet Commonly known as:  ADVIL,MOTRIN Take 400-600 mg by mouth every 6 (six) hours as needed for fever or moderate pain.   levothyroxine 112 MCG tablet Commonly known as:  SYNTHROID, LEVOTHROID Take 112 mcg by mouth daily before breakfast.        DISCHARGE INSTRUCTIONS:  See AVS.  If you experience worsening of your admission symptoms, develop shortness of breath, life threatening emergency, suicidal or homicidal thoughts you must seek medical attention immediately by calling 911 or calling your MD immediately  if symptoms less severe.  You Must read complete instructions/literature along with all the possible adverse reactions/side effects for all the Medicines you take and that have been prescribed to you. Take any new Medicines after you have completely understood and accpet all the possible adverse reactions/side effects.   Please note  You were cared for by a hospitalist during your hospital stay. If you have any questions about your discharge medications or the care you received while you were in the hospital after you are  discharged, you can call the unit and asked to speak with the hospitalist on call if the hospitalist that took care of you is not available. Once you are discharged, your primary care physician will handle any further medical issues. Please note that NO REFILLS for any  discharge medications will be authorized once you are discharged, as it is imperative that you return to your primary care physician (or establish a relationship with a primary care physician if you do not have one) for your aftercare needs so that they can reassess your need for medications and monitor your lab values.    On the day of Discharge:  VITAL SIGNS:  Blood pressure 106/75, pulse 93, temperature 99 F (37.2 C), temperature source Oral, resp. rate 18, height 5\' 7"  (1.702 m), weight 82.1 kg, SpO2 93 %. PHYSICAL EXAMINATION:  GENERAL:  47 y.o.-year-old patient lying in the bed with no acute distress.  EYES: Pupils equal, round, reactive to light and accommodation. No scleral icterus. Extraocular muscles intact.  HEENT: Head atraumatic, normocephalic. Oropharynx and nasopharynx clear.  NECK:  Supple, no jugular venous distention. No thyroid enlargement, no tenderness.  LUNGS: Normal breath sounds bilaterally, no wheezing, rales,rhonchi or crepitation. No use of accessory muscles of respiration.  CARDIOVASCULAR: S1, S2 normal. No murmurs, rubs, or gallops.  ABDOMEN: Soft, non-tender, non-distended. Bowel sounds present. No organomegaly or mass.  EXTREMITIES: No pedal edema, cyanosis, or clubbing.  NEUROLOGIC: Cranial nerves II through XII are intact. Muscle strength 5/5 in all extremities. Sensation intact. Gait not checked.  PSYCHIATRIC: The patient is alert and oriented x 3.  SKIN: No obvious rash, lesion, or ulcer.  DATA REVIEW:   CBC Recent Labs  Lab 05/05/18 0237  WBC 10.6  HGB 12.1*  HCT 34.7*  PLT 215    Chemistries  Recent Labs  Lab 05/03/18 1034 05/04/18 0339  NA 133* 135  K 3.3* 3.5  CL 101 103  CO2 23 24  GLUCOSE 150* 107*  BUN 14 15  CREATININE 0.83 0.82  CALCIUM 9.2 8.5*  AST 21  --   ALT 28  --   ALKPHOS 99  --   BILITOT 1.3*  --      Microbiology Results  Results for orders placed or performed during the hospital encounter of 05/03/18    Culture, blood (routine x 2)     Status: None (Preliminary result)   Collection Time: 05/03/18  4:26 PM  Result Value Ref Range Status   Specimen Description BLOOD BLOOD LEFT ARM  Final   Special Requests   Final    BOTTLES DRAWN AEROBIC AND ANAEROBIC Blood Culture results may not be optimal due to an excessive volume of blood received in culture bottles   Culture   Final    NO GROWTH 2 DAYS Performed at [facility], [address], [place], [place] [address]    Report Status PENDING  Incomplete  Culture, blood (routine x 2)     Status: None (Preliminary result)   Collection Time: 05/03/18  4:31 PM  Result Value Ref Range Status   Specimen Description BLOOD BLOOD RIGHT ARM  Final   Special Requests   Final    BOTTLES DRAWN AEROBIC AND ANAEROBIC Blood Culture results may not be optimal due to an excessive volume of blood received in culture bottles   Culture   Final    NO GROWTH 2 DAYS Performed at [facility], [address] [place], [place] [address]    Report Status  PENDING  Incomplete    RADIOLOGY:  No results found.   Management plans discussed with the patient, family and they are in agreement.  CODE STATUS: Prior   TOTAL TIME TAKING CARE OF THIS PATIENT: 33 minutes.    [name] M.D on 05/05/2018 at 5:35 PM  Between 7am to 6pm - Pager - [phone]  After 6pm go to [email or web address] - Social research officer, government  [facility] [place] [facility]  Office  [phone]  CC: Primary care physician; System, Pcp Not In   Note: This dictation was prepared with Dragon dictation along with smaller phrase technology. Any transcriptional errors that result from this process are unintentional.

## 2018-05-05 NOTE — Progress Notes (Signed)
[facility] - [place] at [facility] Eugene Barnes was admitted to the Hospital on 05/03/2018 and Discharged  05/05/2018 and should be excused from work/school   for 7 days starting 05/03/2018 , may return to work/school without any restrictions.  [name] M.D on 05/05/2018,at 9:30 AM  [facility] - [place] at [facility]  [phone]

## 2018-05-06 LAB — HIV ANTIBODY (ROUTINE TESTING W REFLEX): HIV SCREEN 4TH GENERATION: NONREACTIVE

## 2018-05-08 LAB — CULTURE, BLOOD (ROUTINE X 2)
CULTURE: NO GROWTH
Culture: NO GROWTH

## 2020-05-08 IMAGING — CT CT ANGIO CHEST
2 of 7 series · 19 of 46 positions shown · IV contrast (APPLIED)
Comparison: 05/03/2018 chest radiograph

CLINICAL DATA: 47-year-old male with acute cough, chest pain and
fever for 6 days..

EXAM:
CT ANGIOGRAPHY CHEST WITH CONTRAST
TECHNIQUE: Multidetector CT imaging of the chest was performed using the
standard protocol during bolus administration of intravenous
contrast. Multiplanar CT image reconstructions and MIPs were
obtained to evaluate the vascular anatomy.
CONTRAST:  75mL OMNIPAQUE IOHEXOL 350 MG/ML SOLN

[Series 5: thins · axial · 0.67mm/px · z∈[-182,+82]mm · 16 of 298 slices shown]
[im 17/298  lung]
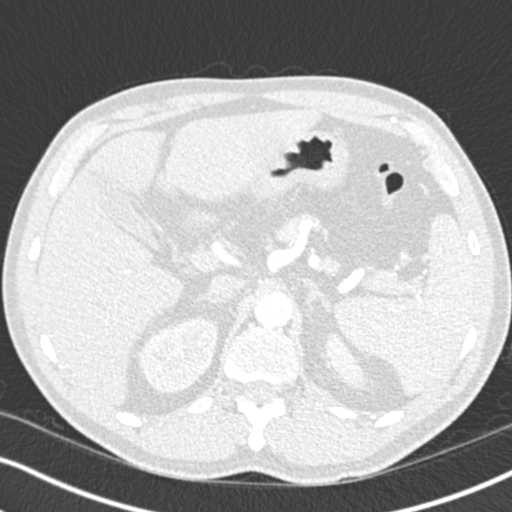
[im 34/298  soft-tissue]
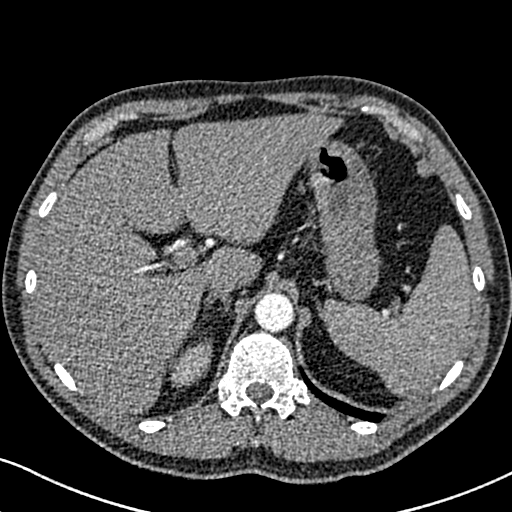
[im 50/298  lung]
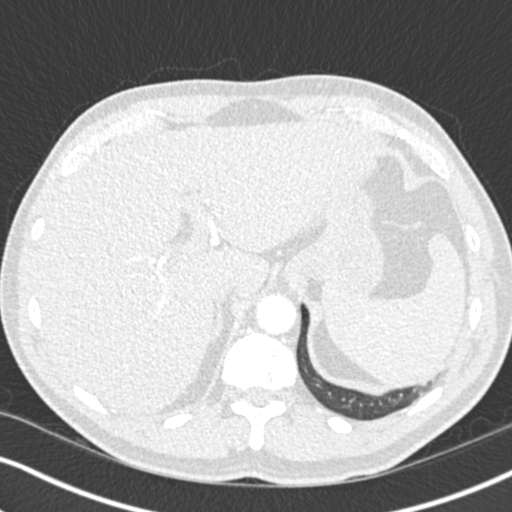
[im 67/298  soft-tissue]
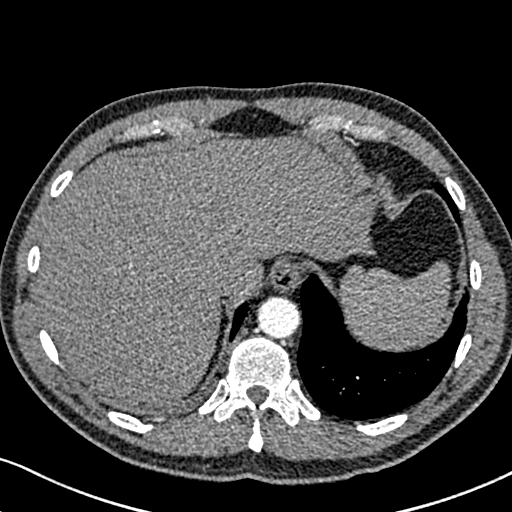
[im 83/298  lung]
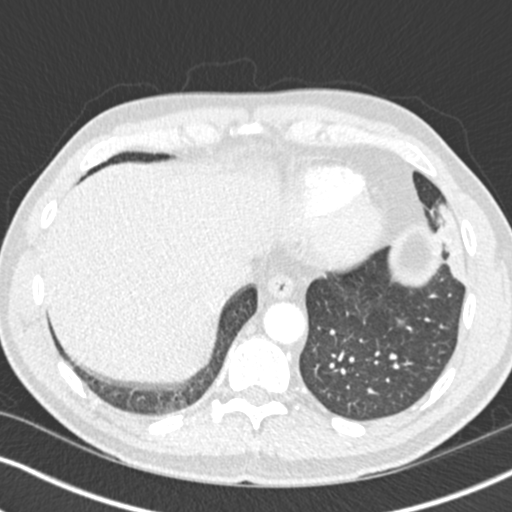
[im 100/298  soft-tissue]
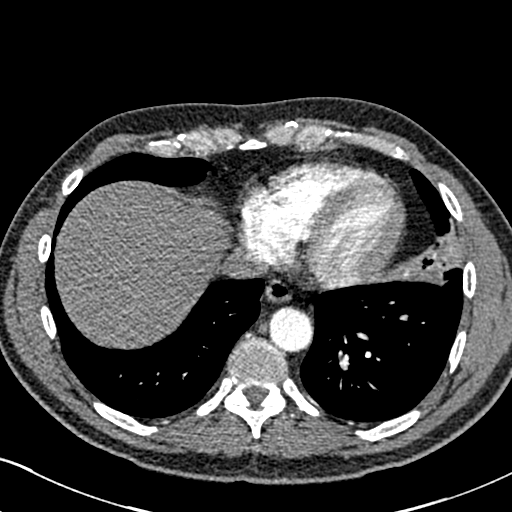
[im 116/298  lung]
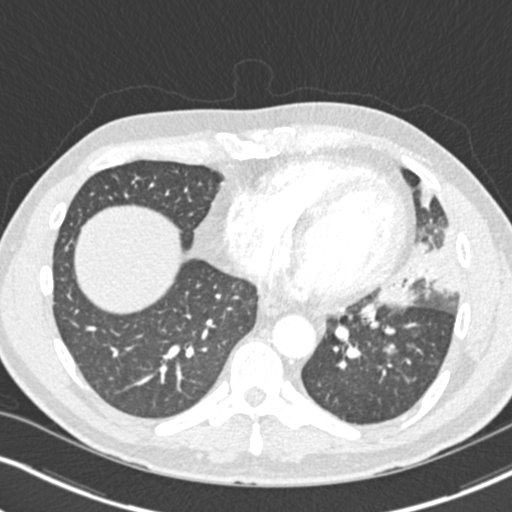
[im 133/298  soft-tissue]
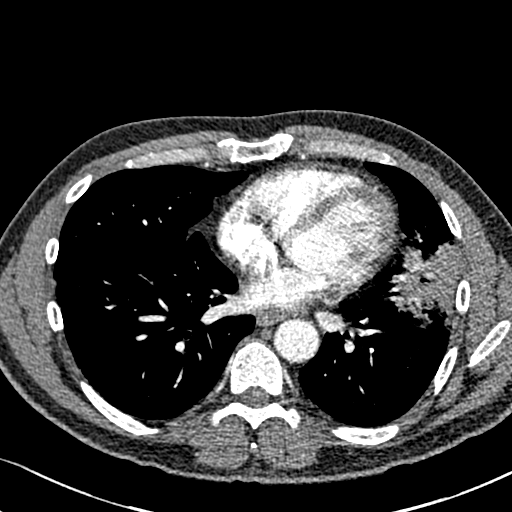
[im 166/298  lung]
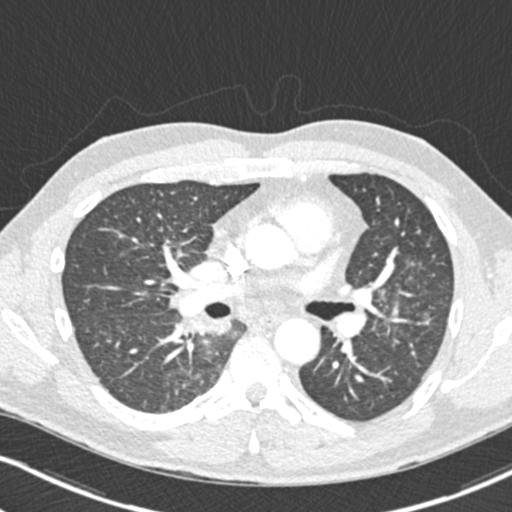
[im 182/298  soft-tissue]
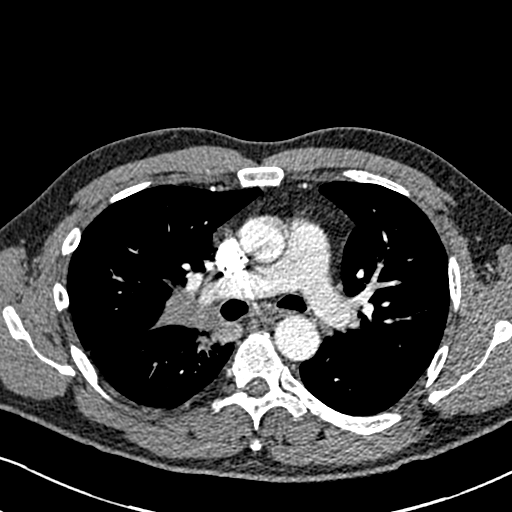
[im 199/298  lung]
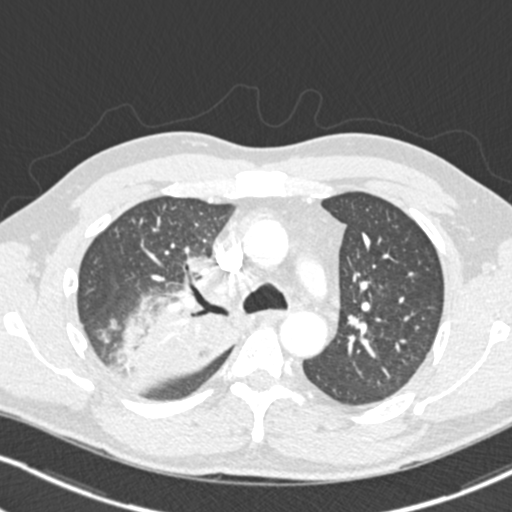
[im 215/298  soft-tissue]
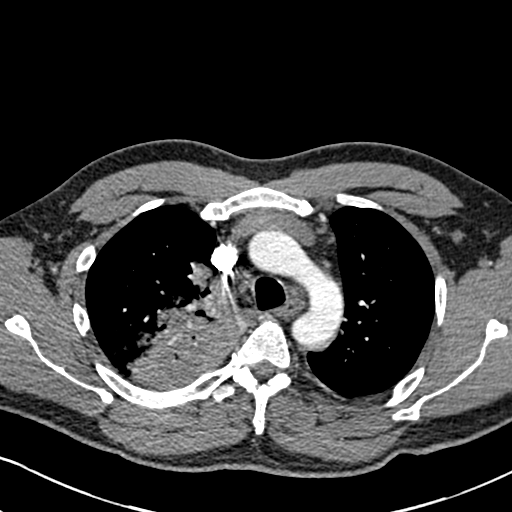
[im 232/298  lung]
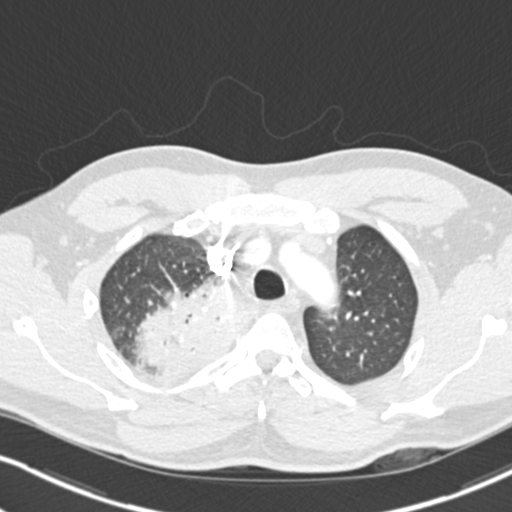
[im 248/298  soft-tissue]
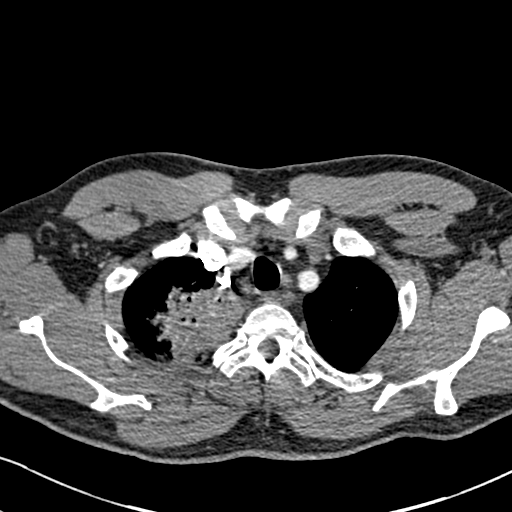
[im 265/298  lung]
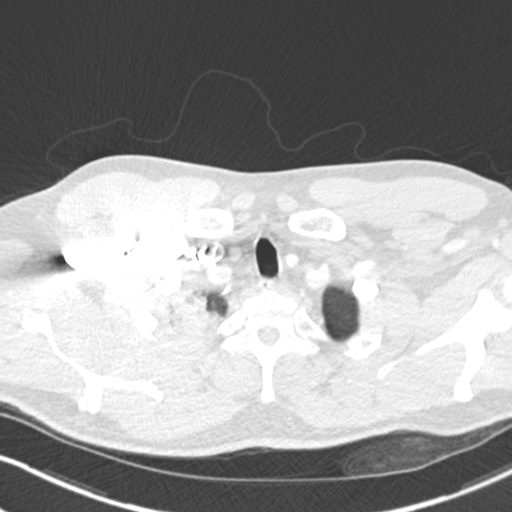
[im 281/298  soft-tissue]
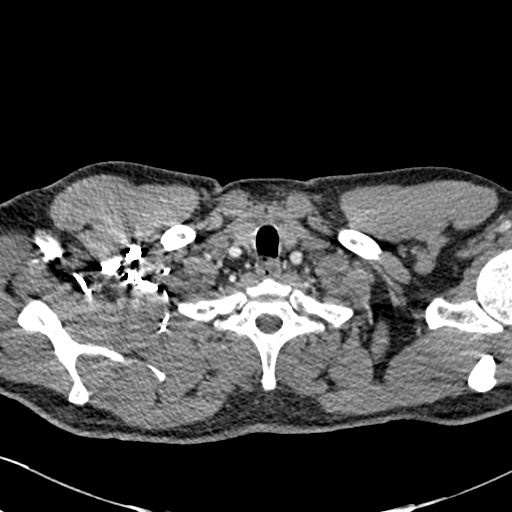

[Series 7: coronal mpr · coronal · 0.62mm/px · 3 of 76 slices shown]
[im 19/76  soft-tissue]
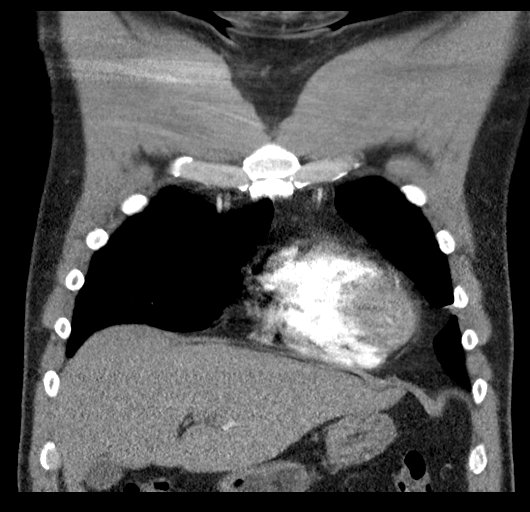
[im 38/76  soft-tissue]
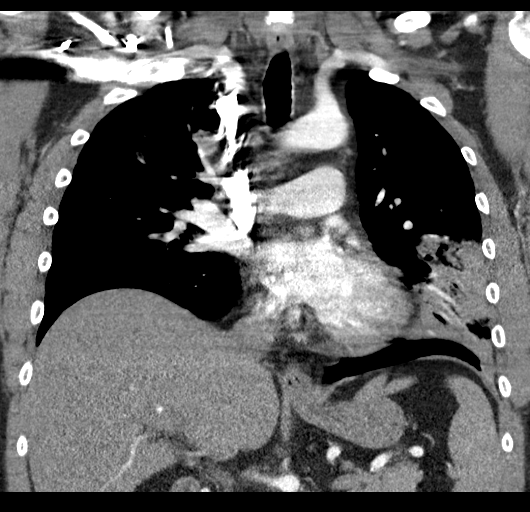
[im 57/76  soft-tissue]
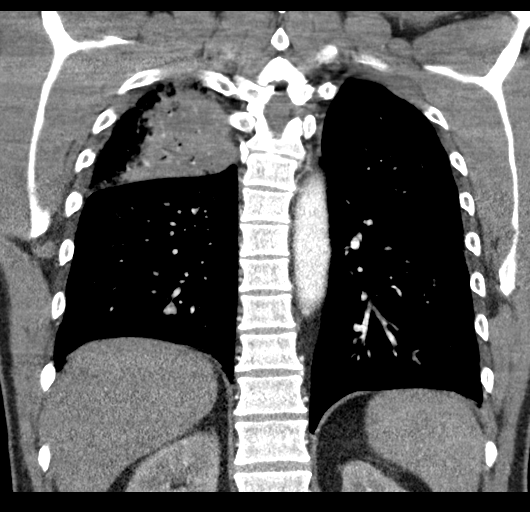

[19 of 46 positions shown; findings below may reference images not displayed]

FINDINGS: Cardiovascular: This is a technically borderline study. No pulmonary
emboli are identified. There is no evidence of thoracic aortic
aneurysm. Normal heart size without pericardial effusion.

Mediastinum/Nodes: Mildly enlarged RIGHT hilar lymph nodes are
likely reactive. No mediastinal mass identified. Visualized thyroid
gland and esophagus are unremarkable.

Lungs/Pleura: Moderate to large area of consolidation within the
RIGHT UPPER lobe, consolidation/airspace disease within the LEFT
UPPER lobe/lingula and mild airspace opacity within the SUPERIOR
segment of the RIGHT LOWER lobe identified. These pulmonary
opacities likely represent pneumonia.

No pleural effusion or pneumothorax. No endobronchial or
endotracheal lesion identified.

Upper Abdomen: No acute abnormality.

Musculoskeletal: No acute or suspicious bony abnormalities.

Review of the MIP images confirms the above findings.
IMPRESSION: 1. Consolidation/airspace disease within bilateral UPPER
lobes/lingula and SUPERIOR segment RIGHT LOWER lobe compatible with
multifocal pneumonia. Radiographic follow-up to resolution is
recommended.
2. No evidence of pulmonary emboli or thoracic aortic aneurysm.
